# Patient Record
Sex: Female | Born: 1965 | Race: Black or African American | Hispanic: No
Health system: Western US, Academic
[De-identification: ages and names within clinical notes are randomized; demographics above are authoritative.]

---

## 2002-08-21 ENCOUNTER — Encounter: Admission: RE | Admit: 2002-08-21 | Discharge: 2002-08-21 | Payer: Self-pay | Admitting: Internal Medicine

## 2002-08-21 ENCOUNTER — Encounter: Payer: Self-pay | Admitting: Internal Medicine

## 2002-09-18 ENCOUNTER — Encounter: Admission: RE | Admit: 2002-09-18 | Discharge: 2002-09-18 | Payer: Self-pay | Admitting: Internal Medicine

## 2002-09-18 ENCOUNTER — Encounter: Payer: Self-pay | Admitting: Internal Medicine

## 2002-11-06 ENCOUNTER — Encounter: Payer: Self-pay | Admitting: Internal Medicine

## 2002-11-06 ENCOUNTER — Encounter: Admission: RE | Admit: 2002-11-06 | Discharge: 2002-11-06 | Payer: Self-pay | Admitting: Internal Medicine

## 2002-11-20 ENCOUNTER — Emergency Department (HOSPITAL_COMMUNITY): Admission: EM | Admit: 2002-11-20 | Discharge: 2002-11-20 | Payer: Self-pay | Admitting: Emergency Medicine

## 2002-12-03 ENCOUNTER — Ambulatory Visit (HOSPITAL_COMMUNITY): Admission: RE | Admit: 2002-12-03 | Discharge: 2002-12-03 | Payer: Self-pay | Admitting: Obstetrics and Gynecology

## 2002-12-03 ENCOUNTER — Encounter: Payer: Self-pay | Admitting: Obstetrics and Gynecology

## 2003-06-23 ENCOUNTER — Ambulatory Visit (HOSPITAL_COMMUNITY): Admission: RE | Admit: 2003-06-23 | Discharge: 2003-06-23 | Payer: Self-pay | Admitting: Neurology

## 2006-03-30 ENCOUNTER — Inpatient Hospital Stay (HOSPITAL_COMMUNITY): Admission: EM | Admit: 2006-03-30 | Discharge: 2006-04-05 | Payer: Self-pay | Admitting: Podiatry

## 2006-03-30 ENCOUNTER — Ambulatory Visit: Payer: Self-pay | Admitting: Internal Medicine

## 2007-08-12 IMAGING — CR DG CHEST 1V PORT
1 series · 1 of 1 positions shown · non-contrast
Comparison: None.

CLINICAL DATA: Shortness of breath. 
 PORTABLE CHEST:

[view not recorded]
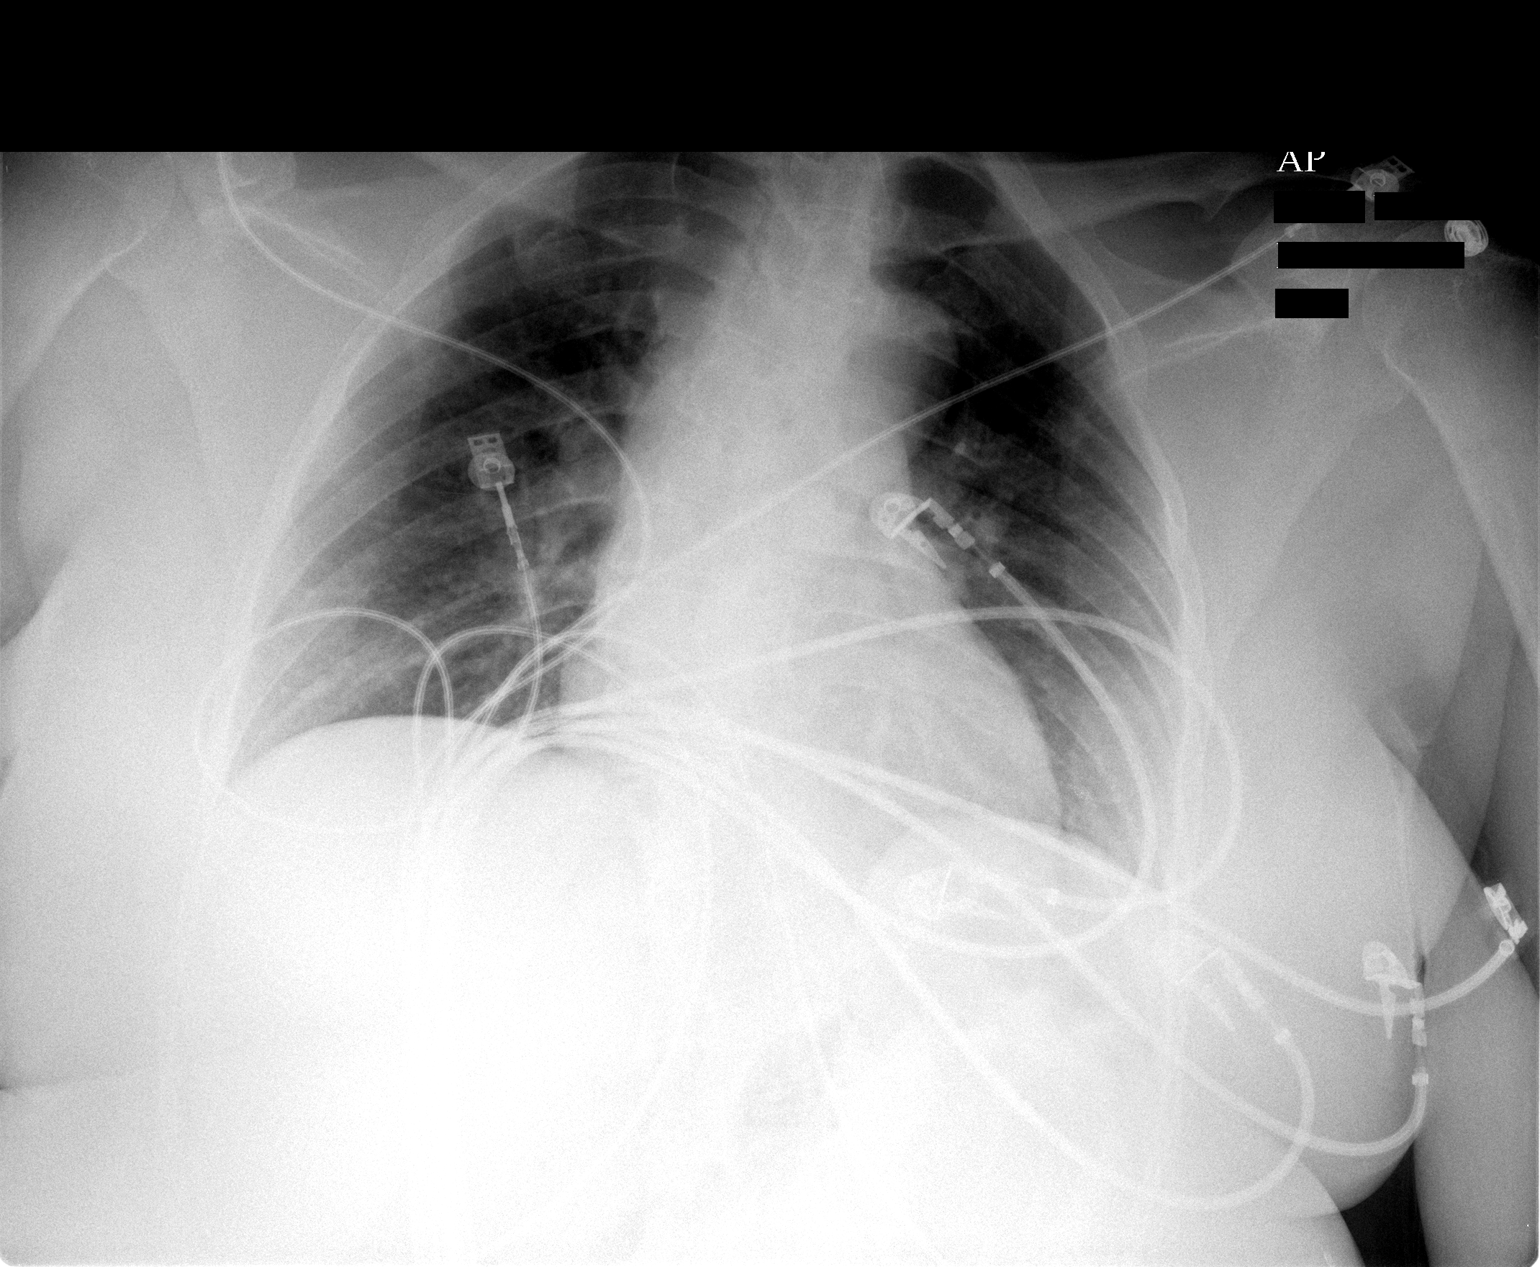

[1 of 1 positions shown; findings below may reference images not displayed]

FINDINGS: Cardiac leads overlie the chest.  Cervical fusion hardware is partly visualized.  Lung volumes are low with bibasilar subsegmental atelectasis.  No focal pulmonary opacity.  Heart size is at the upper limits for normal.
IMPRESSION: Low lung volumes with bibasilar atelectasis.

## 2007-08-14 IMAGING — CT CT ANGIO CHEST
2 of 3 series · 19 of 36 positions shown · IV contrast (APPLIED)
Comparison: none

CLINICAL DATA: Asthma.  Evaluate for pulmonary embolism and evaluate infiltrates.
CT ANGIOGRAPHY OF CHEST:
TECHNIQUE: Multidetector CT imaging of the chest was performed during bolus injection of intravenous contrast.  Multiplanar CT angiographic image reconstructions were generated to evaluate the vascular anatomy.  Patient was pretreated with 25 mg of IV Benadryl immediately before the scan.  She has been on Solu-Medrol.
Contrast:  80 cc Omnipaque 300

[Series 4: pulm embolism 2.0 b31f st · axial · 0.59mm/px · z∈[-271,-39]mm · 16 of 126 slices shown]
[im 5/126  lung]
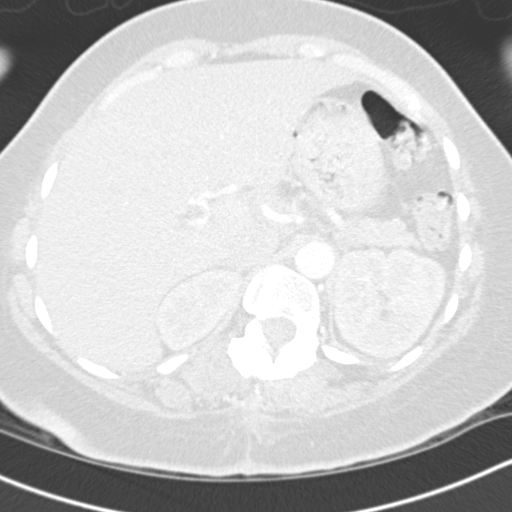
[im 14/126  mediastinal]
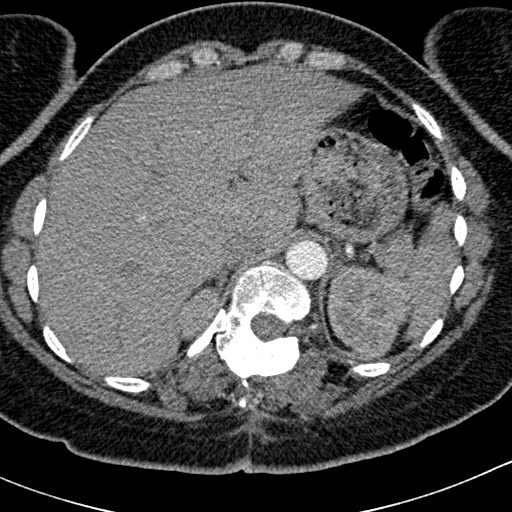
[im 24/126  lung]
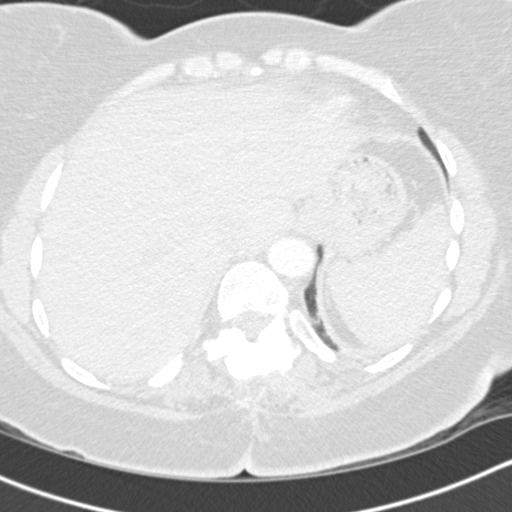
[im 28/126  mediastinal]
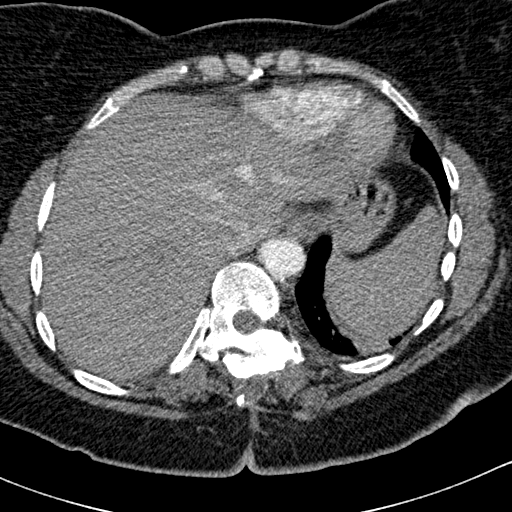
[im 38/126  lung]
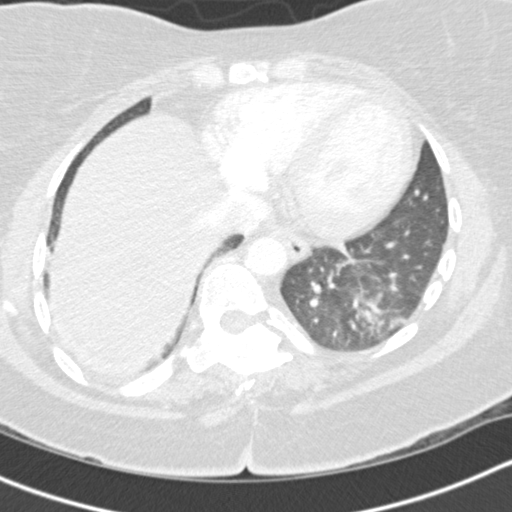
[im 42/126  mediastinal]
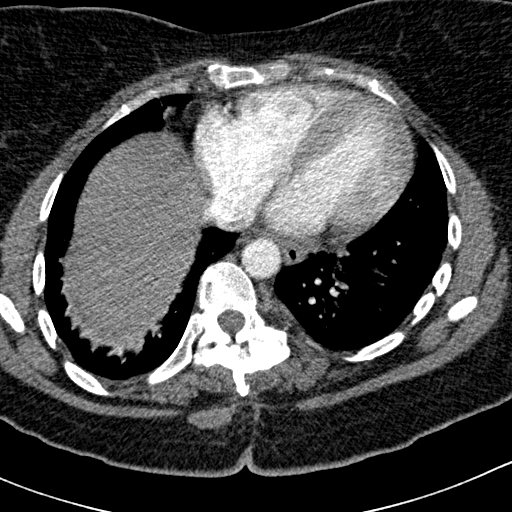
[im 51/126  lung]
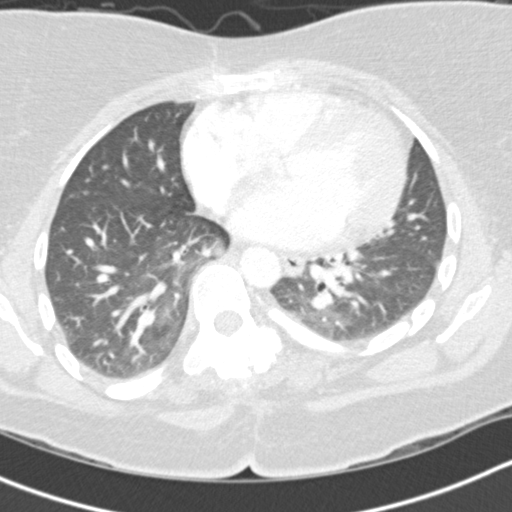
[im 61/126  mediastinal]
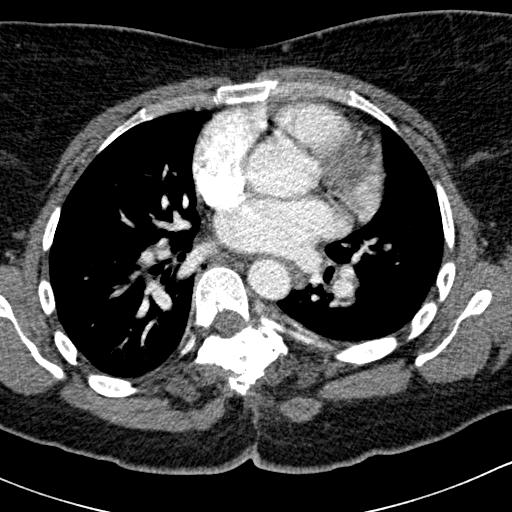
[im 65/126  lung]
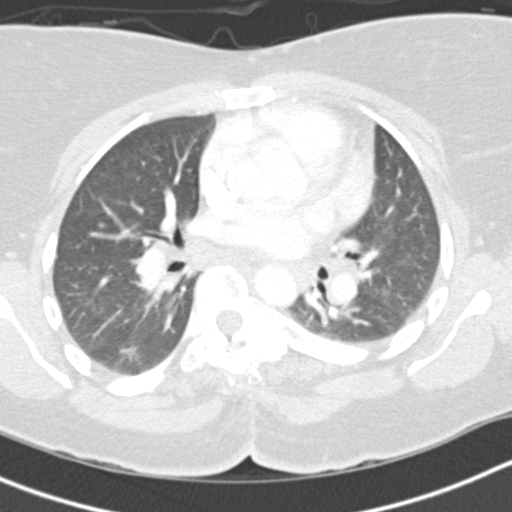
[im 75/126  mediastinal]
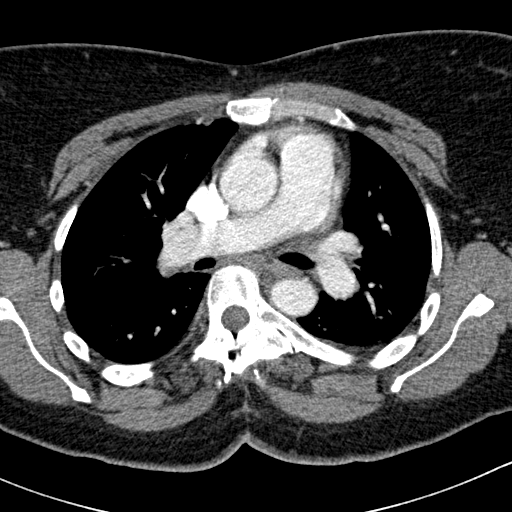
[im 84/126  lung]
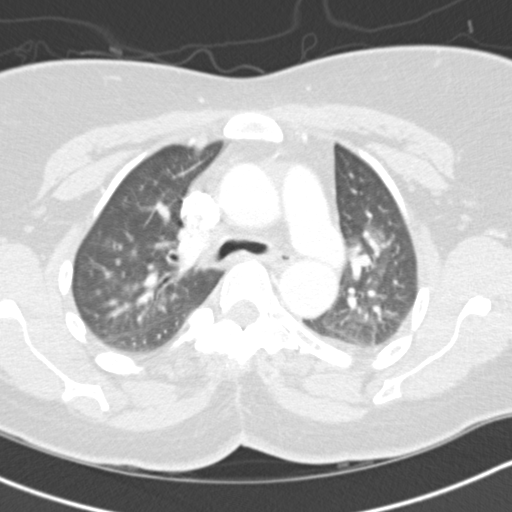
[im 88/126  mediastinal]
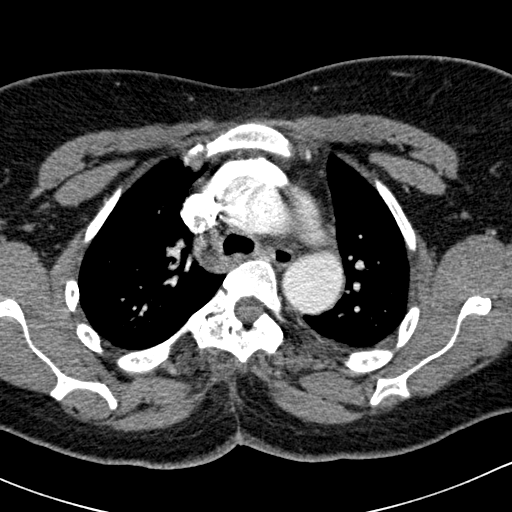
[im 98/126  lung]
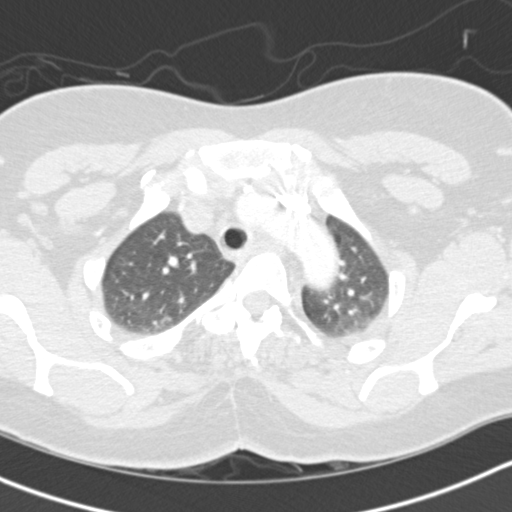
[im 102/126  mediastinal]
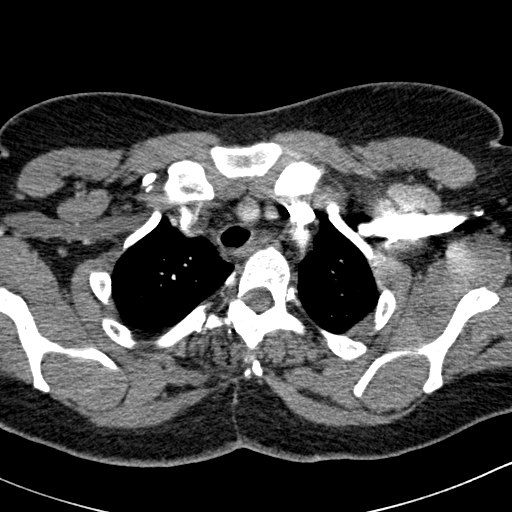
[im 112/126  lung]
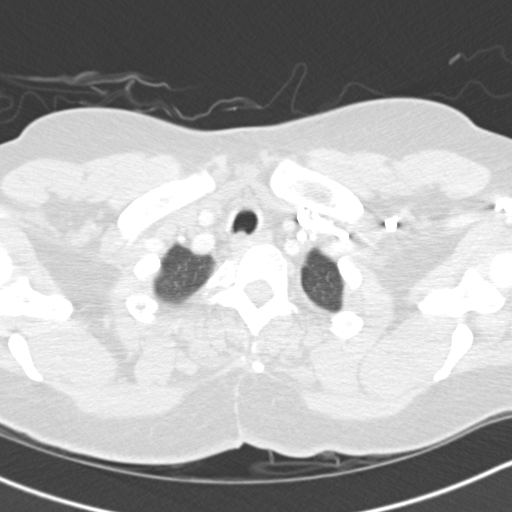
[im 121/126  mediastinal]
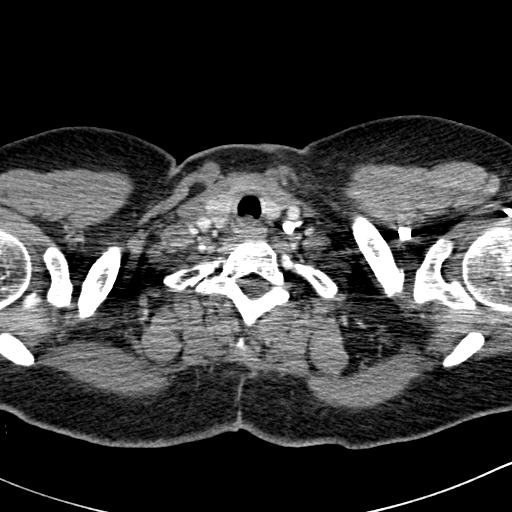

[Series 7: cor · coronal · 0.59mm/px · 3 of 91 slices shown]
[im 19/91  mediastinal]
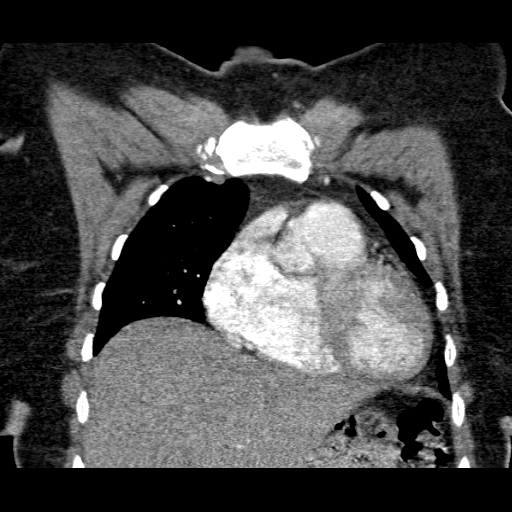
[im 37/91  mediastinal]
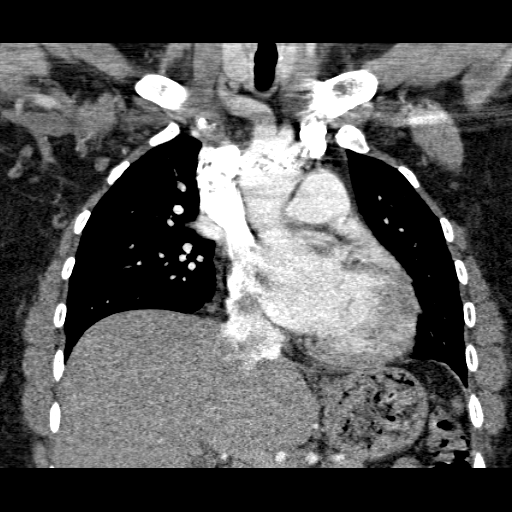
[im 55/91  mediastinal]
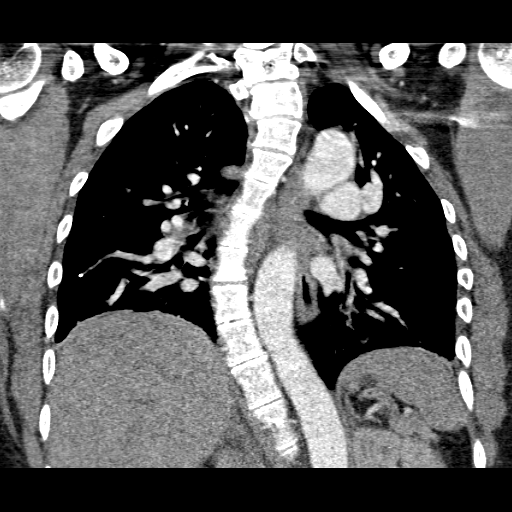

[19 of 36 positions shown; findings below may reference images not displayed]

FINDINGS: Negative for pulmonary embolism.  Bilateral hilar adenopathy.  There are slightly prominent in size nodes in the intersegmental regions of the perihilar zones.  alcified right suprahilar node compatible with old granulomatous disease.  Bilateral pulmonary ground glass opacities are noted most prominent in the left upper lobe and in the lower lobes.  No pleural or pericardial effusion.  Thoracic scoliosis convex to the right.
IMPRESSION: Negative for pulmonary embolism.  Bihilar adenopathy.  Nonspecific ground glass opacities of the lungs may suggest an active process such as alveolitis. A few differential diagnostic considerations are nonspecific interstitial pneumonitis, sarcoidosis, hypersensitivity pneumonitis, DIP, and possibly infectious pneumonitis, for example, due to viral process.

## 2007-08-14 IMAGING — CR DG CHEST 2V
2 series · 2 of 2 positions shown · non-contrast
Comparison: 03/31/06.

CLINICAL DATA: Asthma, cough, shortness of breath.
 CHEST ? 2 VIEW:

[view not recorded (1 of 2)]
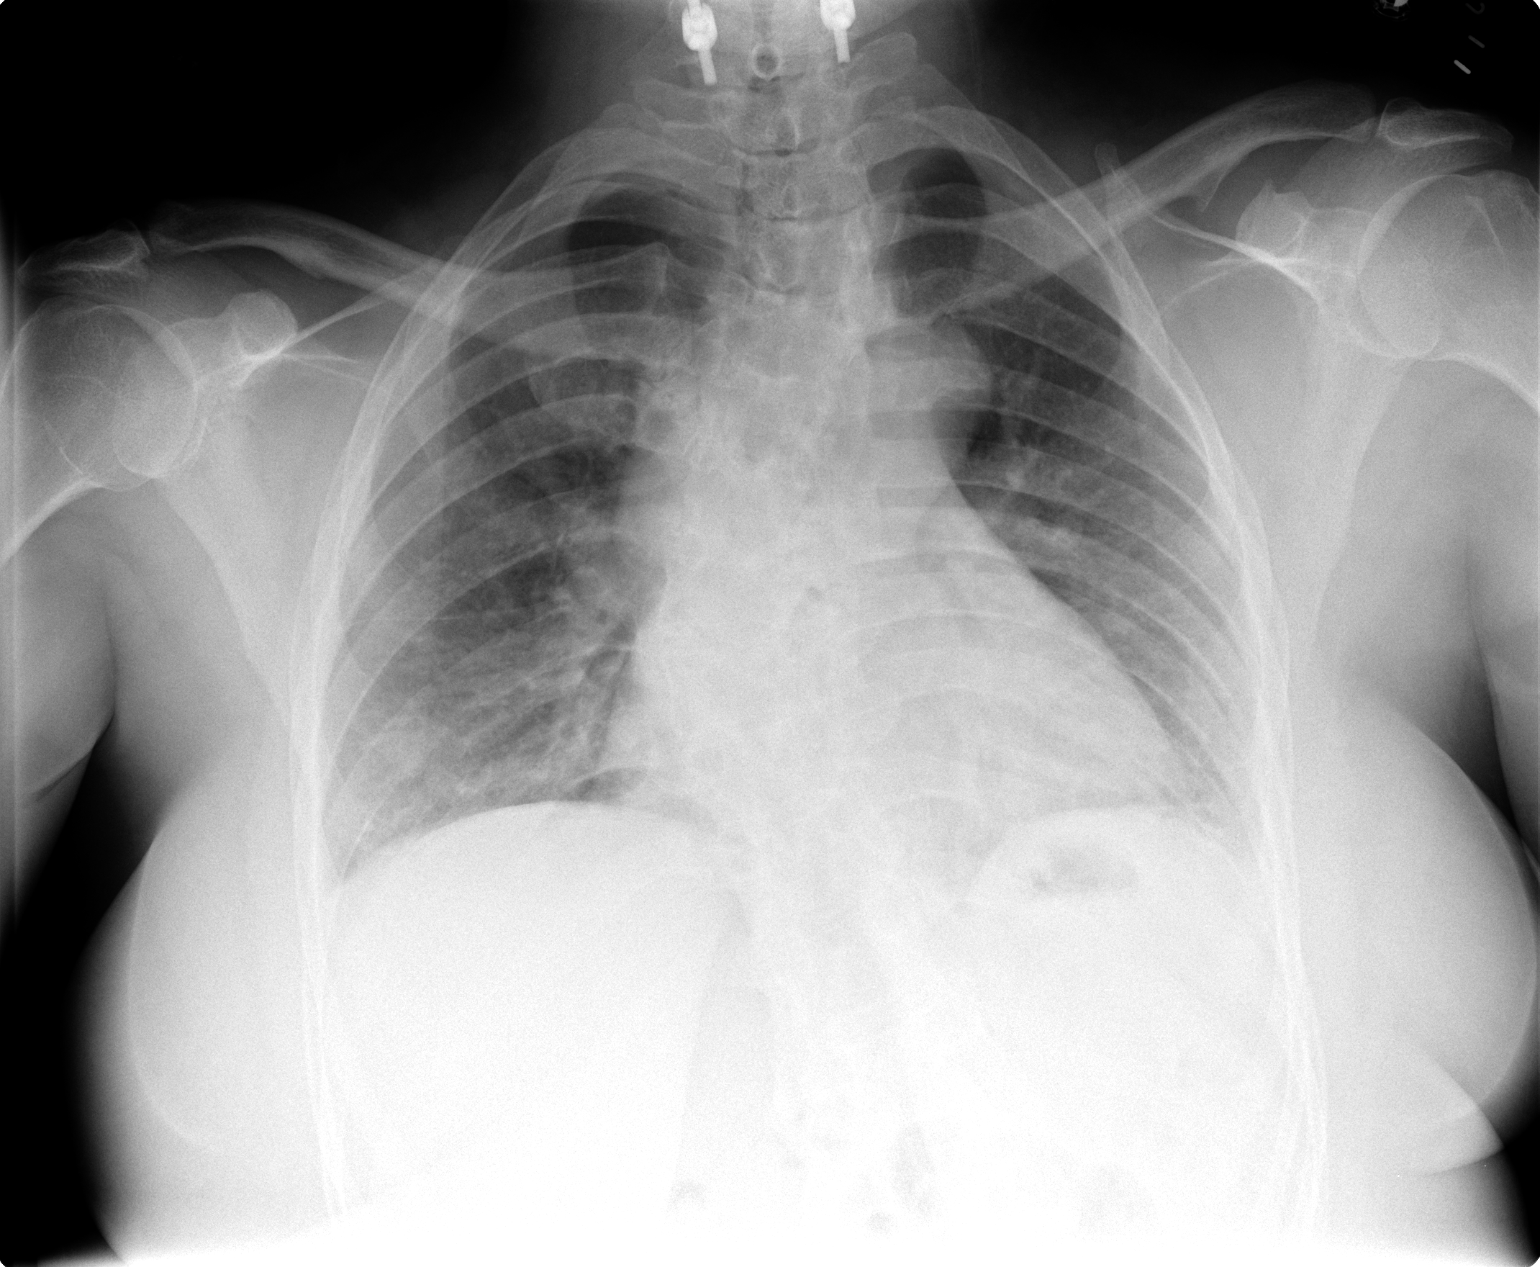

[view not recorded (2 of 2)]
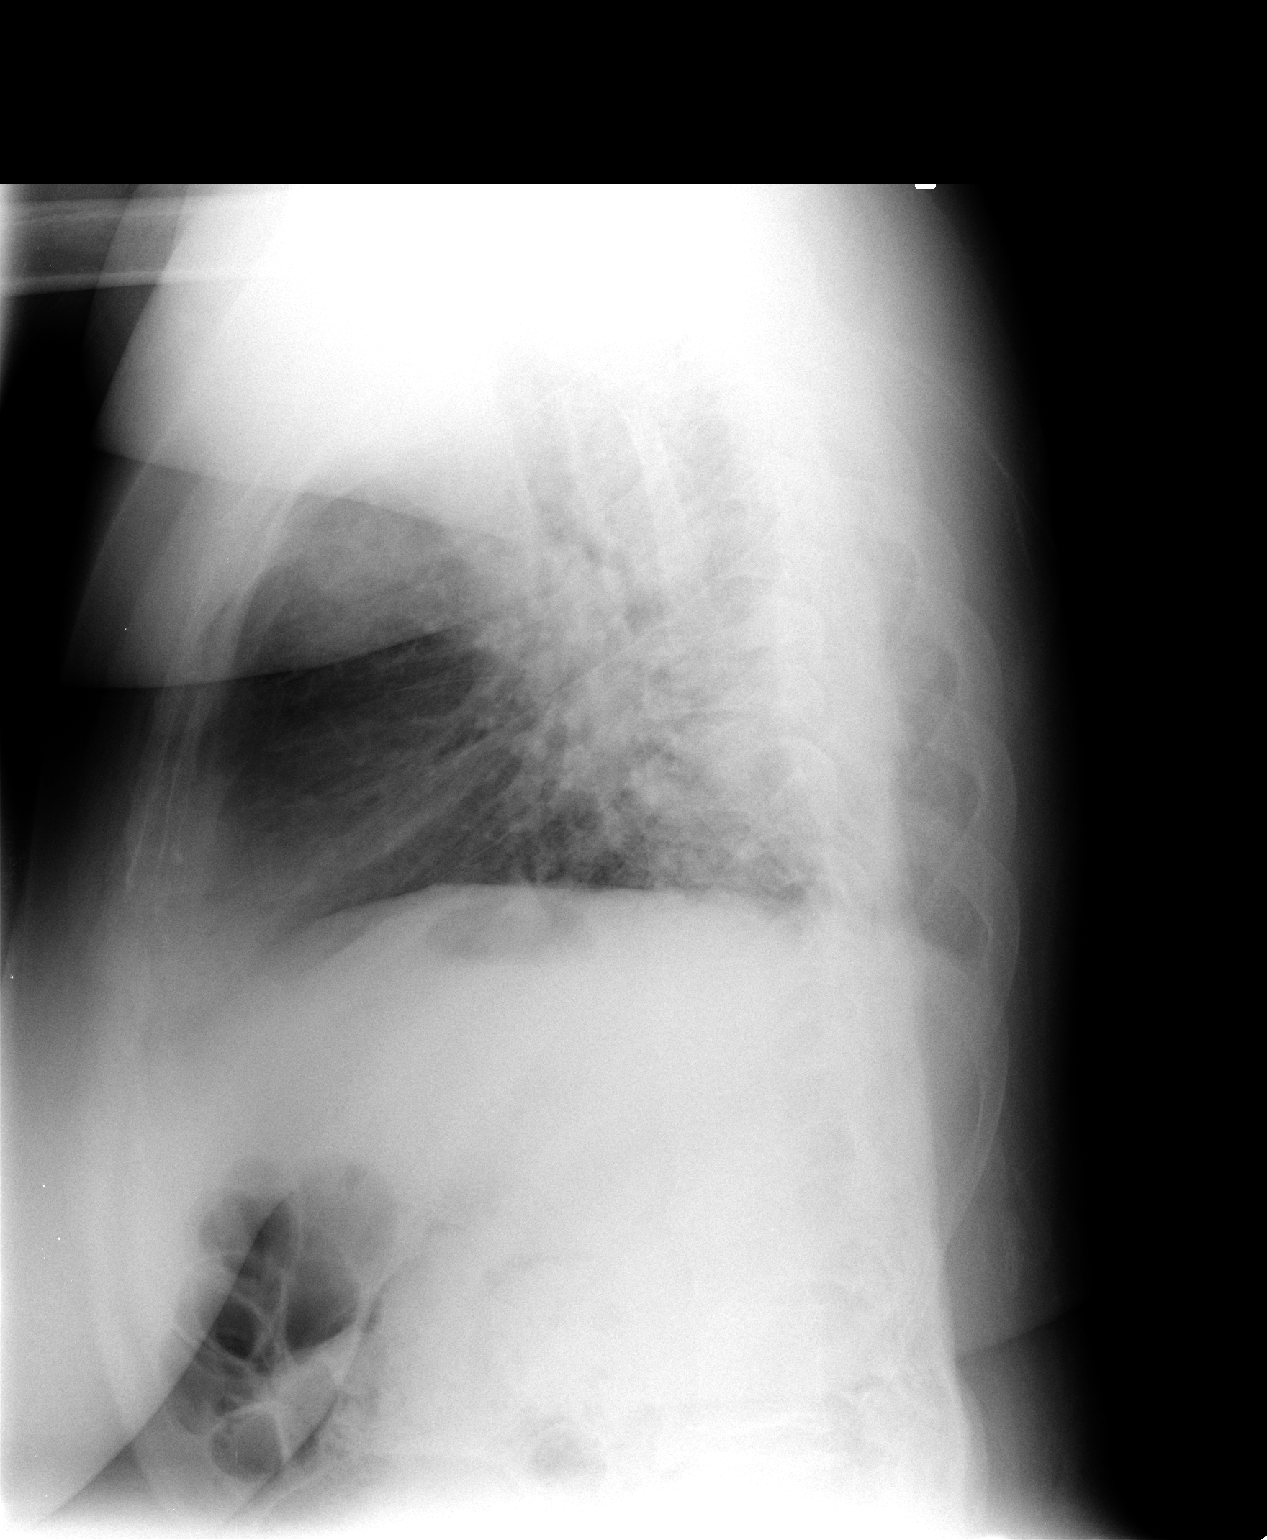

[2 of 2 positions shown; findings below may reference images not displayed]

FINDINGS: The patient is filmed at a low degree of inspiration and there are mild bibasilar atelectatic changes.  The aorta is elongated and ectatic.  There are mildly accentuated perihilar interstitial markings.
IMPRESSION: Mild patchy bibasilar atelectatic/infiltrative changes and mild perihilar interstitial accentuation.  There is question of mild interstitial edema.  The aorta is elongated and ectatic but unchanged.

## 2017-07-29 ENCOUNTER — Telehealth: Payer: Self-pay

## 2017-07-29 NOTE — Telephone Encounter (Signed)
Error

## 2023-04-18 NOTE — Progress Notes (Signed)
Mail Received    Mailed records received by front desk 04/18/23   Uploaded: CT Lumbar 01/15/23, CT Thoracic 01/15/23, XR Chest 11/30/22, XR Pelvis 11/30/22, XR Shoulder 11/30/22, CT Head 11/30/22, CT Facial Bones 11/30/22, CT Cervical 11/30/22, CT Chest 03/31/23, XR Pelvis 03/31/23, CT Head 03/31/23  Notified patient navigator.

## 2023-04-18 NOTE — Progress Notes (Signed)
Upcoming NP appt.    Magnus Ivan, PA-C  Physician Assistant  [facility]

## 2023-05-20 ENCOUNTER — Ambulatory Visit
Payer: Commercial Managed Care - HMO | Attending: Student in an Organized Health Care Education/Training Program | Primary: Physician

## 2023-05-20 DIAGNOSIS — M549 Dorsalgia, unspecified: Secondary | ICD-10-CM

## 2023-05-20 MED ORDER — OMEPRAZOLE 20 MG CAPSULE,DELAYED RELEASE
20 | ORAL | 1.00 refills | 30.00000 days | Status: DC
Start: 2023-05-20 — End: 2024-04-23

## 2023-05-20 MED ORDER — RANOLAZINE ER 500 MG TABLET,EXTENDED RELEASE,12 HR
500 | Freq: Two times a day (BID) | ORAL | Status: AC
Start: 2023-05-20 — End: ?

## 2023-05-20 MED ORDER — DILTIAZEM 120 MG TABLET
120 | Freq: Every day | ORAL | Status: AC
Start: 2023-05-20 — End: ?

## 2023-05-20 MED ORDER — NITROGLYCERIN ORAL
ORAL | Status: AC
Start: 2023-05-20 — End: ?

## 2023-05-20 MED ORDER — ROSUVASTATIN 10 MG TABLET
10 | Freq: Every day | ORAL | Status: AC
Start: 2023-05-20 — End: ?

## 2023-05-20 MED ORDER — CETIRIZINE 10 MG TABLET
10 | Freq: Two times a day (BID) | ORAL | Status: AC
Start: 2023-05-20 — End: ?

## 2023-05-20 MED ORDER — TOPIRAMATE 100 MG TABLET
100 | Freq: Every day | ORAL | 3.00 refills | 90.00000 days | Status: DC
Start: 2023-05-20 — End: 2024-04-23

## 2023-05-20 MED ORDER — RIVAROXABAN 20 MG TABLET
20 | Freq: Every day | ORAL | Status: AC
Start: 2023-05-20 — End: ?

## 2023-05-20 MED ORDER — UNKNOWN TO PATIENT
Status: AC
Start: 2023-05-20 — End: ?

## 2023-05-20 MED ORDER — ACETAMINOPHEN ER 650 MG TABLET,EXTENDED RELEASE
650 | Freq: Every day | ORAL | Status: AC | PRN
Start: 2023-05-20 — End: ?

## 2023-05-20 MED ORDER — PREGABALIN 300 MG CAPSULE
300 | Freq: Two times a day (BID) | ORAL | 1.00 refills | 30.00000 days | Status: DC
Start: 2023-05-20 — End: 2024-04-23

## 2023-05-20 MED ORDER — LISINOPRIL 10 MG-HYDROCHLOROTHIAZIDE 12.5 MG TABLET
Freq: Every day | ORAL | Status: AC
Start: 2023-05-20 — End: ?

## 2023-05-20 MED ORDER — TIZANIDINE 4 MG TABLET
4 | Freq: Every day | ORAL | Status: AC
Start: 2023-05-20 — End: ?

## 2023-05-20 MED ORDER — FERROUS SULFATE 324 MG (65 MG IRON) TABLET,DELAYED RELEASE
324 | Freq: Every day | ORAL | 1.00 refills | 84.00000 days | Status: DC
Start: 2023-05-20 — End: 2024-04-23

## 2023-05-20 MED ORDER — ALLOPURINOL 300 MG TABLET
300 | Freq: Every day | ORAL | 1.00 refills | 30.00000 days | Status: AC
Start: 2023-05-20 — End: ?

## 2023-05-20 MED ORDER — DULOXETINE 30 MG CAPSULE,DELAYED RELEASE
30 | Freq: Every day | ORAL | Status: AC
Start: 2023-05-20 — End: ?

## 2023-05-20 NOTE — H&P (Unsigned)
I performed this consultation using real-time Telehealth tools, including a live video connection between my location and the patient's location. Prior to initiating the consultation, I obtained informed verbal consent to perform this consultation using Telehealth tools and answered all the questions about the Telehealth interaction.     I saw Janet Mills today at the [facility].      APP Visit Information:     APP Service Type:  Independent  Available MD consultant:  Loura Halt, MD      {Complexity of data reviewed (optional):28233}       REASON FOR VISIT:   INITIAL EVALUATION    CHIEF COMPLAINT:   Neck and back pain      REFERRING PROVIDER:   Eliezer Lofts, MD       HISTORY OF PRESENT ILLNESS:   Janet Mills is a 57 y.o. female with neck and back pain.      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 She has seen a Engineer, petroleum for this; it is not scheduled yet. She states she has a crack in her neck that doesn't go away. She had an ACDF and a PCDF. She first had the PCDF done in [place]; she thinks about 15 years ago, and the ACDF about 12 years ago. Initially she feels she recovered well.     In 2017 she went to [facility], because she was bending forward. She states "they opened up my spine from top to bottom" to help her stand straight. Per her description this was a staged front to back. She had a leak of spinal cord fluid but per [facility] the source was never found. She states she ended up with a craniotomy on the left side.     She states she has broken spinal hardware in her low back. She had a craniotomy done at [facility] and was ultimately transferred back to [place].     She admits to back pain radiating to the groin, and has trouble with a heaviness in her left leg. She feels like her legs are getting worse in terms of neuropathy. She walks with a walker, but it is slow and is quite an effort. She can make half a  block. She is generally weak and has not noticed any particular weaknes sin her arms and hand . After the bleed she got generally weak all over. Before she was stronger on the left side, but since the bleed the right side is the strong side. She has a hand tremor which is worsening. She had a fall a couple of weeks that made a hole in the wall. She admits to headaches and takes Topiramate, but these are worsening and now daily. Her headaches improve with laying down. Tylenol does not help.     On a VAS pain scale of 0-10, patient describes their pain as follows:  Neck pain: *** /10  Back pain: ***/10  Right arm pain: ***/10   Left arm pain: ***/10   Right leg pain: ***/10  Left leg pain: *** /10  The ratio of back pain to leg pain is: ***  Pain is aggravated with ***  Pain is alleviated with ***    Positional pain:  Laying down > *** < Sitting  Sitting > *** < Standing  Standing> *** < Walking  Currently the patient's walking tolerance is ***.  Assistive devices: FWW    No *** numbness, tingling, or weakness of  the legs.    No *** problems with sense of balance.    No *** numbness, tingling, or weakness of the arms.    No *** loss of dexterity or worsening of grip strength.   No *** perineal numbness.    + B/B incontinence - incontinence of both since surgery. 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    Relevant history:  *** No relevant abdominal surgeries  *** No relevant orthopedic surgeries  *** No mood disorder    Patient has undergone treatments consisting of:   Hot/cold application - ***  Physical therapy/ Home Exercise Program (HEP) - ***  Chiropractic/Acupuncture/Massage - ***  Injections/Pain Management Interventions: ***  TENS/Traction/Inversion: ***    Current and past medication trials to manage their pain/symptoms:  NSAIDs - ***  Tylenol - ***  Opioids - ***  Muscle relaxants - ***  Topicals - ***  Oral steroids - ***  Gabapentinoids - ***    Daily MME (morphine milligram  equivalents)  ***    Recent Dexa Study/Osteo Tx   ***    Relevant Social History:  ***    PAST MEDICAL HISTORY:   No past medical history on file.      PAST SURGICAL HISTORY:   No past surgical history on file.      /CONTRAINDICATIONS:   Allergies/Contraindications  Not on File      CURRENT MEDICATIONS:   No current outpatient medications       SOCIAL HISTORY:     Social History     Socioeconomic History    Marital status: Divorced     Spouse name: Not on file    Number of children: Not on file    Years of education: Not on file    Highest education level: Not on file   Occupational History    Not on file   Tobacco Use    Smoking status: Not on file    Smokeless tobacco: Not on file   Substance and Sexual Activity    Alcohol use: Not on file    Drug use: Not on file    Sexual activity: Not on file   Other Topics Concern    Not on file   Social History Narrative    Not on file     Social Determinants of Health     Financial Resource Strain: Not on file   Food Insecurity: Not on file   Transportation Needs: Not on file   Housing Stability: Not on file         PHYSICAL EXAM:   Constitutional: Patient is oriented to person, place, and time.   Pulmonary/Chest: Effort normal on room air  Neurological:   GCS eye subscore is 4. GCS verbal subscore is 5. GCS motor subscore is 6.     Motor Strength:   D B T WF WE IO FF (Grip)  R 5 5 5 5 5 5 5   L 5 5 5 5 5 5 5        IP Q H TA EHL GS  R 5 5 5 5 5 5   L 5 5 5 5 5 5     Reflexes:  Biceps:  R ***  L***  Triceps:   R ***  L***  BR:    R ***  L***  Patellar:   R ***  L***  Achilles:   R ***  L***    Patient ambulates in a normal fashion without  assistive devices.  Patient can heel and toe stand.  Negative ***Romberg  Patient can single leg stand for *** seconds  Patient can perform a tandem gait *** difficulty.  Sensation intact throughout *** bilateral upper and lower extremities.   No tenderness with palpation over the greater trochanter***    Negative Hoffman's sign  bilaterally.  Negative ankle clonus bilaterally.    There is no height or weight on file to calculate BMI.    IMAGING:   I have personally reviewed and interpreted the following studies:    ***    REPORTS REVIEWED:   No results found.     DIAGNOSIS:   {No diagnosis found. (Refresh or delete this SmartLink)}    IMPRESSION AND PLAN:     Destiny Gasaway is a 57 y.o. female ***          Recommendations for today:  I explained to [facility] I needed to do a focused records review due to the complexity of her case, and she understands this.          The diagnosis and treatment options were discussed and reviewed in detail with the patient today who communicated understanding with the plan.  The patient is encouraged to send a message through MyChart or the [facility] at [phone] with any questions orconcerns.  This is also the main scheduling number to make an appointment.

## 2023-05-22 ENCOUNTER — Telehealth: Admit: 2023-05-22 | Payer: Commercial Managed Care - HMO | Attending: Physician Assistant | Primary: Physician

## 2023-05-22 DIAGNOSIS — M549 Dorsalgia, unspecified: Secondary | ICD-10-CM

## 2023-05-22 NOTE — Progress Notes (Deleted)
Spoke to pt OTP as previously arranged. She consents to pursuing recent imaging orders. She requests orders be faxed to facility below:    [facility] [address]    [address]  [address]  [place], [place] [address]    Phone:  [phone]    Fax:  [phone]

## 2023-05-22 NOTE — Progress Notes (Signed)
Spoke to pt OTP as previously arranged. She agrees to pursue MRI workup locally and asks [place] to fax to facility below:    [facility] [address]    [address]  [address]  [place], [place] [address]    Phone:  [phone]    Fax:  [phone]    I will follow up with a video appointment with Keiran once images and reports are rec'd

## 2024-02-18 ENCOUNTER — Inpatient Hospital Stay: Admit: 2024-02-18 | Discharge: 2024-02-28 | Payer: Medicare HMO | Primary: Physician

## 2024-02-18 DIAGNOSIS — M549 Dorsalgia, unspecified: Secondary | ICD-10-CM

## 2024-03-27 ENCOUNTER — Inpatient Hospital Stay: Admit: 2024-03-27 | Payer: Medicare HMO | Primary: Physician

## 2024-03-27 ENCOUNTER — Inpatient Hospital Stay: Admit: 2024-03-27 | Discharge: 2017-08-09 | Payer: Medicare HMO | Primary: Physician

## 2024-03-27 DIAGNOSIS — M8589 Other specified disorders of bone density and structure, multiple sites: Secondary | ICD-10-CM

## 2024-03-28 LAB — DEXA BONE DENSITY FOREARM
Right 3rd Radius BMD: 0.648
Right 3rd Radius Z Score: 0.5
Right 3rd RadiusT Score: -0.8
Right Total Radius BMD: 0.563
Right Total Radius T Score: -0.3
Right Total Radius Z Score: 0.9
Right Ultradistal Radius BMD: 0.464
Right Ultradistal Radius T Score: 0.4
Right Ultradistal Radius Z Score: 1.3

## 2024-03-28 LAB — DEXA BONE DENSITY SPINE/HIP
Left Femur Neck BMD: 0.836
Left Femur Neck T Score: -0.1
Left Femur Neck Z Score: 0.2
Left Total Hip BMD: 0.91
Left Total Hip T Score: -0.3
Left Total Hip Z Score: 0

## 2024-04-23 ENCOUNTER — Telehealth
Admit: 2024-04-23 | Payer: Medicare HMO | Attending: Student in an Organized Health Care Education/Training Program | Primary: Physician

## 2024-04-23 DIAGNOSIS — Z9889 Other specified postprocedural states: Secondary | ICD-10-CM

## 2024-04-23 MED ORDER — TOPIRAMATE 50 MG TABLET
50 | Freq: Two times a day (BID) | ORAL | 0.00 refills | Status: AC
Start: 2024-04-23 — End: ?

## 2024-04-23 NOTE — Progress Notes (Signed)
 I performed this consultation using real-time Telehealth tools, including a live video connection between my location and the patient's location. Prior to initiating the consultation, I obtained informed verbal consent to perform this consultation using Telehealth tools and answered all the questions about the Telehealth interaction.       I performed this evaluation using real-time telehealth tools, including a live video Zoom connection between my location and the patient's location. Prior to initiating, the patient consented to perform this evaluation using telehealth tools.    I saw Janet Mills today at the [facility].      APP Visit Information:   APP Service Type:  Shared             REASON FOR VISIT:   Follow up    CHIEF COMPLAINT:   Neck and back pain      REFERRING PROVIDER:   Jerrlyn MARLA Kays, MD       HISTORY OF PRESENT ILLNESS:   Legacy Janet Mills is a 58 y.o. female with neck and back pain.      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 She has seen a Engineer, petroleum for this; it is not scheduled yet. She states she has a crack in her neck that doesn't go away. She had an ACDF and a PCDF. She first had the PCDF done in [place] ; she thinks about 15 years ago, and the ACDF about 12 years ago. Initially she feels she recovered well.     In 2017 she went to [facility], because she was bending forward. She states "they opened up my spine from top to bottom" to help her stand straight. Per her description this was a staged front to back. She had a leak of spinal cord fluid but per [facility] [place] [facility] the source was never found. She states she ended up with a craniotomy on the left side.     She states she has broken spinal hardware in her low back. She had a craniotomy done at [facility] and was ultimately transferred back to [place].     She admits to back pain radiating to the groin, and has trouble with a heaviness in her left leg. She  feels like her legs are getting worse in terms of neuropathy. She walks with a walker, but it is slow and is quite an effort. She can make half a block. She is generally weak and has not noticed any particular weakness in her arms and hand . After the bleed she got generally weak all over. Before she was stronger on the left side, but since the bleed the right side is the strong side. She has a hand tremor which is worsening. She had a fall a couple of weeks that made a hole in the wall. She admits to headaches and takes Topiramate , but these are worsening and now daily. Her headaches improve with laying down. Tylenol  does not help.       INTERIM HISTORY 04/23/24:   Patient wants to review test results. Her low back pain and neck pain persist.    She reports limited mobility in the neck postoperatively. She attended postop PT years ago.    She has developed pain in the right shoulder to elbow, sometimes radiating a little past the elbow, sparing the hand.    She reports recent falls, most recently 2 weeks ago.     She has been having "strange" headaches in the back/top, and side  or her head. She is doing a brain MRI next week. These headaches developed a few months ago.      PAST MEDICAL HISTORY:     Past Medical History:   Diagnosis Date    Anemia     Anxiety     Arrhythmia     Depression     GERD (gastroesophageal reflux disease)     Hypercholesterolemia     Hypertension     Obesity     Seizures (CMS code)     Sleep apnea     CPAP    Stroke (CMS code)        PAST SURGICAL HISTORY:     Past Surgical History:   Procedure Laterality Date    BRAIN SURGERY      to stop bleeding craniotomy    breast reduction sx      CARDIAC SURGERY      3 heart ablastions    JOINT REPLACEMENT      replaced left shoulder    SPINE SURGERY      multiple         /CONTRAINDICATIONS:     Allergies/Contraindications   Allergen Reactions    Latex Itching    Adhesive Rash     Other reaction(s): Rash   Severe rash from ecg leads left for 24  hours   Severe rash   Severe rash from ecg leads left for 24 hours    Other reaction(s): Rash   Severe rash from ecg leads left for 24 hours   Severe rash   Severe rash from ecg leads left for 24 hours   Other reaction(s): Rash   Severe rash from ecg leads left for 24 hours   Severe rash    Other reaction(s): Rash   Severe rash from ecg leads left for 24 hours   Severe rash   Severe rash from ecg leads left for 24 hours   Other reaction(s): Rash   Severe rash from ecg leads left for 24 hours   Severe rash   Other reaction(s): Rash   Severe rash from ecg leads left for 24 hours   Severe rash   Severe rash from ecg leads left for 24 hours    Morphine Shortness Of Breath    Sulfa (Sulfonamide Antibiotics) Anaphylaxis    Sulfur Anaphylaxis    Adhesive Tape-Silicones      Patient Reported Allergy via Notable    Pepper (Genus Capsicum)     Shellfish Derived Itching         CURRENT MEDICATIONS:     Current Outpatient Medications   Medication Instructions    acetaminophen  (TYLENOL ) 1,300 mg, Daily PRN    allopurinol  (ZYLOPRIM ) 300 mg    cetirizine  (ZYRTEC ) 20 mg, 2 times daily    diltiazem  (CARDIZEM ) 120 mg, Daily Scheduled    DULoxetine  (CYMBALTA ) 30 mg, Daily At Bedtime Scheduled    lisinopriL -hydrochlorothiazide  (PRINZIDE ,ZESTORETIC ) 10-12.5 mg tablet 1 tablet, Daily Scheduled    NITROGLYCERIN  ORAL Take by mouth prn    pregabalin  (LYRICA ) 300 mg, Oral, Twice Daily    ranolazine  (RANEXA ) 500 mg, 2 Times Daily Scheduled    rivaroxaban  (XARELTO ) 20 mg, Daily Scheduled    rosuvastatin  (CRESTOR ) 10 mg, Daily Scheduled    tiZANidine  (ZANAFLEX ) 4 mg, Daily At Bedtime Scheduled    topiramate  (TOPAMAX ) 50 mg, 2 Times Daily Scheduled    UNKNOWN TO PATIENT CPAP use          SOCIAL HISTORY:  Social History     Socioeconomic History    Marital status: Divorced     Spouse name: Not on file    Number of children: Not on file    Years of education: Not on file    Highest education level: Not on file   Occupational History    Not on  file   Tobacco Use    Smoking status: Never    Smokeless tobacco: Not on file   Substance and Sexual Activity    Alcohol use: Not Currently    Drug use: Not Currently    Sexual activity: Not on file   Other Topics Concern    Not on file   Social History Narrative    Not on file     Social Drivers of Health     Financial Resource Strain: Not on file   Food Insecurity: Not on file   Transportation Needs: Not on file   Housing Stability: Not on file         PHYSICAL EXAM:   The patient is alert and participatory with today's video visit.      IMAGING:   I have personally reviewed and interpreted the following studies:                REPORTS REVIEWED:   Records of complex previous procedures reviewed.     DIAGNOSIS:     Visit Diagnoses     ICD-10-CM    1. S/P spinal surgery  Z98.890             IMPRESSION AND PLAN:     Janet Mills is a 57 y.o. female with a complex medical history. To summarize:    PMHx  Cerebral palsy, obesity, OSA, Afib s/p ablation    Timeline  02/29/16 - L4-S1 ALIF by Dr. 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             She is concerned about fluid pocket in her back but she does not have any signs and symptoms of CSF leak and the appearance of the fluid collection is stable since 2023 which is the oldest images I have.    With regard to her neck pain, we have dicussed the need for extensive surgery to correct focal kyphosis but I again this appears to have been stable as well. However, it is likely that the abnormal posture that the surgery  has rendered her cervical spine in with high C2 slope and focal kyphosis does contribute to her neck pain. I discussed briefly the surgery that would be necessary to address this but she was clear that she is not interested in pursuing surgery.    I discussed with patient the potential risks, complications, and benefits associated with surgery.  Risks may include but not limited to blood loss, neurovascular injury, DVT, PE, infection, fracture, pseudoarthrosis, hardware loosening, stroke, paralysis, and possibly even death.  I explained to patient the expected post op recovery course.  The patient understands that motion of the spine would be lost at the fused segments.  The patient also understands there is a risk for adjacent segment degeneration at the segment above the fusion in 5-10 years which may require an extension of the fusion in the future.      In brief, the patient will either need a Upper throacic PSO, or multilevel anterior ACDF, possible corpectomy (given the OPLL) and posterior segmental fixation and fusion, likely C2-T2. This need to be further worked up should the patient decides to proceed with standing xray to measure T1 slope, and also flexion/extension xray    Recommendations for today:  As needed follow up  The diagnosis and treatment options were discussed and reviewed in detail with the patient today who communicated  understanding with the plan.  The patient is encouraged to send a message through MyChart or the [facility] at [phone] with any questions orconcerns.  This is also the main scheduling number to make an appointment.
